# Patient Record
Sex: Male | Born: 2000 | Race: Black or African American | Hispanic: No | Marital: Single | Smoking: Never smoker
Health system: Southern US, Community
[De-identification: ages and names within clinical notes are randomized; demographics above are authoritative.]

## PROBLEM LIST (undated history)

## (undated) DIAGNOSIS — F32A Depression, unspecified: Secondary | ICD-10-CM

## (undated) DIAGNOSIS — F913 Oppositional defiant disorder: Secondary | ICD-10-CM

---
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
## 2021-03-21 ENCOUNTER — Observation Stay (HOSPITAL_BASED_OUTPATIENT_CLINIC_OR_DEPARTMENT_OTHER)
Admission: EM | Admit: 2021-03-21 | Discharge: 2021-03-22 | Disposition: A | Discharge status: Home / Self Care | Payer: Medicaid Other | Attending: Internal Medicine | Admitting: Internal Medicine

## 2021-03-21 ENCOUNTER — Emergency Department (HOSPITAL_BASED_OUTPATIENT_CLINIC_OR_DEPARTMENT_OTHER): Payer: Medicaid Other

## 2021-03-21 ENCOUNTER — Encounter (HOSPITAL_BASED_OUTPATIENT_CLINIC_OR_DEPARTMENT_OTHER): Payer: Self-pay | Admitting: Radiology

## 2021-03-21 ENCOUNTER — Other Ambulatory Visit: Payer: Self-pay

## 2021-03-21 DIAGNOSIS — J188 Other pneumonia, unspecified organism: Secondary | ICD-10-CM | POA: Diagnosis present

## 2021-03-21 DIAGNOSIS — J9601 Acute respiratory failure with hypoxia: Secondary | ICD-10-CM | POA: Diagnosis not present

## 2021-03-21 DIAGNOSIS — J189 Pneumonia, unspecified organism: Principal | ICD-10-CM

## 2021-03-21 DIAGNOSIS — R0602 Shortness of breath: Secondary | ICD-10-CM | POA: Diagnosis present

## 2021-03-21 DIAGNOSIS — Z20822 Contact with and (suspected) exposure to covid-19: Secondary | ICD-10-CM | POA: Diagnosis not present

## 2021-03-21 HISTORY — DX: Oppositional defiant disorder: F91.3

## 2021-03-21 LAB — COMPREHENSIVE METABOLIC PANEL
ALT: 12 U/L (ref 0–44)
AST: 28 U/L (ref 15–41)
Albumin: 4.2 g/dL (ref 3.5–5.0)
Alkaline Phosphatase: 67 U/L (ref 38–126)
Anion gap: 8 (ref 5–15)
BUN: 11 mg/dL (ref 6–20)
CO2: 27 mmol/L (ref 22–32)
Calcium: 8.9 mg/dL (ref 8.9–10.3)
Chloride: 99 mmol/L (ref 98–111)
Creatinine, Ser: 1.24 mg/dL (ref 0.61–1.24)
GFR, Estimated: >60 mL/min (ref >60)
Glucose, Bld: 102 mg/dL — ABNORMAL HIGH (ref 70–99)
Potassium: 4 mmol/L (ref 3.5–5.1)
Sodium: 134 mmol/L — ABNORMAL LOW (ref 135–145)
Total Bilirubin: 0.4 mg/dL (ref 0.3–1.2)
Total Protein: 8.1 g/dL (ref 6.5–8.1)

## 2021-03-21 LAB — CBC WITH DIFFERENTIAL/PLATELET
Abs Immature Granulocytes: 0.04 10*3/uL (ref 0.00–0.07)
Basophils Absolute: 0 10*3/uL (ref 0.0–0.1)
Basophils Relative: 0 %
Eosinophils Absolute: 0.2 10*3/uL (ref 0.0–0.5)
Eosinophils Relative: 2 %
HCT: 43.8 % (ref 39.0–52.0)
Hemoglobin: 14.6 g/dL (ref 13.0–17.0)
Immature Granulocytes: 0 %
Lymphocytes Relative: 8 %
Lymphs Abs: 0.9 10*3/uL (ref 0.7–4.0)
MCH: 28.5 pg (ref 26.0–34.0)
MCHC: 33.3 g/dL (ref 30.0–36.0)
MCV: 85.4 fL (ref 80.0–100.0)
Monocytes Absolute: 1.1 10*3/uL — ABNORMAL HIGH (ref 0.1–1.0)
Monocytes Relative: 9 %
Neutro Abs: 9.4 10*3/uL — ABNORMAL HIGH (ref 1.7–7.7)
Neutrophils Relative %: 81 %
Platelets: 265 10*3/uL (ref 150–400)
RBC: 5.13 MIL/uL (ref 4.22–5.81)
RDW: 12 % (ref 11.5–15.5)
WBC: 11.7 10*3/uL — ABNORMAL HIGH (ref 4.0–10.5)
nRBC: 0 % (ref 0.0–0.2)

## 2021-03-21 LAB — BRAIN NATRIURETIC PEPTIDE: B Natriuretic Peptide: 52.1 pg/mL (ref 0.0–100.0)

## 2021-03-21 LAB — TROPONIN I (HIGH SENSITIVITY): Troponin I (High Sensitivity): 2 ng/L (ref <18)

## 2021-03-21 LAB — RESP PANEL BY RT-PCR (FLU A&B, COVID) ARPGX2
Influenza A by PCR: NEGATIVE
Influenza B by PCR: NEGATIVE
SARS Coronavirus 2 by RT PCR: NEGATIVE

## 2021-03-21 LAB — PROCALCITONIN: Procalcitonin: <0.1 ng/mL

## 2021-03-21 MED ORDER — SODIUM CHLORIDE 0.9 % IV SOLN
1.0000 g | Freq: Once | INTRAVENOUS | Status: AC
  Administered 2021-03-21: 04:00:00 1 g via INTRAVENOUS
  Filled 2021-03-21: qty 10

## 2021-03-21 MED ORDER — IOHEXOL 350 MG/ML SOLN
100.0000 mL | Freq: Once | INTRAVENOUS | Status: AC | PRN
  Administered 2021-03-21: 03:00:00 100 mL via INTRAVENOUS

## 2021-03-21 MED ORDER — ACETAMINOPHEN 325 MG PO TABS
650.0000 mg | ORAL_TABLET | Freq: Four times a day (QID) | ORAL | Status: DC | PRN
  Administered 2021-03-21 – 2021-03-22 (×2): 650 mg via ORAL
  Filled 2021-03-21 (×2): qty 2

## 2021-03-21 MED ORDER — ACETAMINOPHEN 650 MG RE SUPP: 650.0000 mg | Freq: Four times a day (QID) | RECTAL | Status: DC | PRN

## 2021-03-21 MED ORDER — ACETAMINOPHEN 500 MG PO TABS
1000.0000 mg | ORAL_TABLET | Freq: Once | ORAL | Status: AC
  Administered 2021-03-21: 02:00:00 1000 mg via ORAL
  Filled 2021-03-21: qty 2

## 2021-03-21 MED ORDER — ONDANSETRON HCL 4 MG PO TABS: 4.0000 mg | ORAL_TABLET | Freq: Four times a day (QID) | ORAL | Status: DC | PRN

## 2021-03-21 MED ORDER — SODIUM CHLORIDE 0.9 % IV SOLN
500.0000 mg | Freq: Once | INTRAVENOUS | Status: AC
  Administered 2021-03-21: 06:00:00 500 mg via INTRAVENOUS
  Filled 2021-03-21: qty 5

## 2021-03-21 MED ORDER — AZITHROMYCIN 250 MG PO TABS
500.0000 mg | ORAL_TABLET | Freq: Every day | ORAL | Status: DC
  Administered 2021-03-22: 10:00:00 500 mg via ORAL
  Filled 2021-03-21: qty 2

## 2021-03-21 MED ORDER — SODIUM CHLORIDE 0.9 % IV SOLN
2.0000 g | INTRAVENOUS | Status: DC
  Administered 2021-03-22: 03:00:00 2 g via INTRAVENOUS
  Filled 2021-03-21: qty 20

## 2021-03-21 MED ORDER — ALBUTEROL SULFATE (2.5 MG/3ML) 0.083% IN NEBU: 2.5000 mg | INHALATION_SOLUTION | RESPIRATORY_TRACT | Status: DC | PRN

## 2021-03-21 MED ORDER — ONDANSETRON HCL 4 MG/2ML IJ SOLN: 4.0000 mg | Freq: Four times a day (QID) | INTRAMUSCULAR | Status: DC | PRN

## 2021-03-21 MED ORDER — GUAIFENESIN ER 600 MG PO TB12
600.0000 mg | ORAL_TABLET | Freq: Two times a day (BID) | ORAL | Status: DC
  Administered 2021-03-21 – 2021-03-22 (×2): 600 mg via ORAL
  Filled 2021-03-21 (×2): qty 1

## 2021-03-21 NOTE — ED Provider Notes (Signed)
[facility] EMERGENCY DEPARTMENT Provider Note   CSN: [id number] Arrival date & time: 03/21/21  0028     History Chief Complaint  Patient presents with   Fever   Shortness of Breath    Gregory Levine is a 20 y.o. male.  20 yo M with a chief complaints of cough shortness of breath.  Going on for about 3 to 4 days now.  No known sick contacts.  Has been coughing up some blood.  Having some central chest pain worse with coughing.  Denies abdominal pain denies nausea or vomiting denies recent travel immobilization or hospitalization.  Denies estrogen use.  The history is provided by the patient.  Fever Associated symptoms: chest pain and cough   Associated symptoms: no chills, no confusion, no congestion, no diarrhea, no headaches, no myalgias, no rash and no vomiting   Shortness of Breath Associated symptoms: chest pain, cough and fever   Associated symptoms: no abdominal pain, no headaches, no rash and no vomiting   Illness Severity:  Moderate Onset quality:  Gradual Duration:  3 days Timing:  Constant Progression:  Worsening Chronicity:  New Associated symptoms: chest pain, cough, fever and shortness of breath   Associated symptoms: no abdominal pain, no congestion, no diarrhea, no headaches, no myalgias, no rash and no vomiting       History reviewed. No pertinent past medical history.  Patient Active Problem List   Diagnosis Date Noted   Multifocal pneumonia 03/21/2021    History reviewed. No pertinent surgical history.     No family history on file.     Home Medications Prior to Admission medications   Not on File    Allergies    Patient has no known allergies.  Review of Systems   Review of Systems  Constitutional:  Positive for fever. Negative for chills.  HENT:  Negative for congestion and facial swelling.   Eyes:  Negative for discharge and visual disturbance.  Respiratory:  Positive for cough and shortness of breath.    Cardiovascular:  Positive for chest pain. Negative for palpitations.  Gastrointestinal:  Negative for abdominal pain, diarrhea and vomiting.  Musculoskeletal:  Negative for arthralgias and myalgias.  Skin:  Negative for color change and rash.  Neurological:  Negative for tremors, syncope and headaches.  Psychiatric/Behavioral:  Negative for confusion and dysphoric mood.    Physical Exam Updated Vital Signs BP 138/79 (BP Location: Right Arm)    Pulse 100    Temp (!) 100.4 F (38 C) (Oral)    Resp 18    Ht 5\' 7"  (1.702 m)    Wt 81.6 kg    SpO2 95%    BMI 28.19 kg/m   Physical Exam Vitals and nursing note reviewed.  Constitutional:      Appearance: He is well-developed.  HENT:     Head: Normocephalic and atraumatic.  Eyes:     Pupils: Pupils are equal, round, and reactive to light.  Neck:     Vascular: No JVD.  Cardiovascular:     Rate and Rhythm: Regular rhythm. Tachycardia present.     Heart sounds: No murmur heard.   No friction rub. No gallop.  Pulmonary:     Effort: Tachypnea present. No respiratory distress.     Breath sounds: No wheezing.  Abdominal:     General: There is no distension.     Tenderness: There is no abdominal tenderness. There is no guarding or rebound.  Musculoskeletal:  General: Normal range of motion.     Cervical back: Normal range of motion and neck supple.  Skin:    Coloration: Skin is not pale.     Findings: No rash.  Neurological:     Mental Status: He is alert and oriented to person, place, and time.  Psychiatric:        Behavior: Behavior normal.    ED Results / Procedures / Treatments   Labs (all labs ordered are listed, but only abnormal results are displayed) Labs Reviewed  CBC WITH DIFFERENTIAL/PLATELET - Abnormal; Notable for the following components:      Result Value   WBC 11.7 (*)    Neutro Abs 9.4 (*)    Monocytes Absolute 1.1 (*)    All other components within normal limits  COMPREHENSIVE METABOLIC PANEL - Abnormal;  Notable for the following components:   Sodium 134 (*)    Glucose, Bld 102 (*)    All other components within normal limits  RESP PANEL BY RT-PCR (FLU A&B, COVID) ARPGX2  CULTURE, BLOOD (ROUTINE X 2)  CULTURE, BLOOD (ROUTINE X 2)  BRAIN NATRIURETIC PEPTIDE  TROPONIN I (HIGH SENSITIVITY)    EKG None  Radiology CT Angio Chest PE W and/or Wo Contrast  Result Date: 03/21/2021 CLINICAL DATA:  Body aches and cough with fever and chills. EXAM: CT ANGIOGRAPHY CHEST WITH CONTRAST TECHNIQUE: Multidetector CT imaging of the chest was performed using the standard protocol during bolus administration of intravenous contrast. Multiplanar CT image reconstructions and MIPs were obtained to evaluate the vascular anatomy. CONTRAST:  192mL OMNIPAQUE IOHEXOL 350 MG/ML SOLN COMPARISON:  None. FINDINGS: Cardiovascular: Satisfactory opacification of the pulmonary arteries to the segmental level. No evidence of pulmonary embolism. Normal heart size. No pericardial effusion. Mediastinum/Nodes: No enlarged mediastinal, hilar, or axillary lymph nodes. Thyroid gland, trachea, and esophagus demonstrate no significant findings. Lungs/Pleura: Mild multifocal infiltrates are seen are seen within the right upper lobe and posteromedial aspects of the bilateral lower lobes. There is no evidence of a pleural effusion or pneumothorax. Upper Abdomen: No acute abnormality. Musculoskeletal: No chest wall abnormality. No acute or significant osseous findings. Review of the MIP images confirms the above findings. IMPRESSION: 1. Mild multifocal right upper lobe and bilateral lower lobe infiltrates. 2. No evidence of pulmonary embolism. Electronically Signed   By: Virgina Norfolk M.D.   On: 03/21/2021 03:12   DG Chest Port 1 View  Result Date: 03/21/2021 CLINICAL DATA:  Shortness of breath and fevers EXAM: PORTABLE CHEST 1 VIEW COMPARISON:  None. FINDINGS: The heart size and mediastinal contours are within normal limits. Both lungs  are clear. The visualized skeletal structures are unremarkable. IMPRESSION: No active disease. Electronically Signed   By: Inez Catalina M.D.   On: 03/21/2021 02:01    Procedures Procedures   Medications Ordered in ED Medications  cefTRIAXone (ROCEPHIN) 1 g in sodium chloride 0.9 % 100 mL IVPB (has no administration in time range)  azithromycin (ZITHROMAX) 500 mg in sodium chloride 0.9 % 250 mL IVPB (has no administration in time range)  acetaminophen (TYLENOL) tablet 1,000 mg (1,000 mg Oral Given 03/21/21 0213)  iohexol (OMNIPAQUE) 350 MG/ML injection 100 mL (100 mLs Intravenous Contrast Given 03/21/21 0247)    ED Course  I have reviewed the triage vital signs and the nursing notes.  Pertinent labs & imaging results that were available during my care of the patient were reviewed by me and considered in my medical decision making (see chart for details).  MDM Rules/Calculators/A&P                         21 yo M with a chief complaints of cough hemoptysis fever and chest discomfort.  Going on for about 3 to 4 days now.  Tachycardic and hypoxic here.  Denies history of smoking or vaping.  No obvious lung finding for me.  Chest x-ray viewed by me without focal infiltrate.  Concern for possible pulmonary embolism will obtain a CT angiogram of the chest.  CT of the chest with multifocal pneumonia.  Negative for PE.  Discussed with hospitalist for admission.  CRITICAL CARE Performed by: Rae Roam   Total critical care time: 35 minutes  Critical care time was exclusive of separately billable procedures and treating other patients.  Critical care was necessary to treat or prevent imminent or life-threatening deterioration.  Critical care was time spent personally by me on the following activities: development of treatment plan with patient and/or surrogate as well as nursing, discussions with consultants, evaluation of patient's response to treatment, examination of patient,  obtaining history from patient or surrogate, ordering and performing treatments and interventions, ordering and review of laboratory studies, ordering and review of radiographic studies, pulse oximetry and re-evaluation of patient's condition.  The patients results and plan were reviewed and discussed.   Any x-rays performed were independently reviewed by myself.   Differential diagnosis were considered with the presenting HPI.  Medications  cefTRIAXone (ROCEPHIN) 1 g in sodium chloride 0.9 % 100 mL IVPB (has no administration in time range)  azithromycin (ZITHROMAX) 500 mg in sodium chloride 0.9 % 250 mL IVPB (has no administration in time range)  acetaminophen (TYLENOL) tablet 1,000 mg (1,000 mg Oral Given 03/21/21 0213)  iohexol (OMNIPAQUE) 350 MG/ML injection 100 mL (100 mLs Intravenous Contrast Given 03/21/21 0247)    Vitals:   03/21/21 0143 03/21/21 0215 03/21/21 0230 03/21/21 0300  BP:  (!) 151/87 (!) 149/82 138/79  Pulse: (!) 121 (!) 106 (!) 101 100  Resp:    18  Temp:      TempSrc:      SpO2: 90% 95% 94% 95%  Weight:      Height:        Final diagnoses:  Multifocal pneumonia  Acute respiratory failure with hypoxia (HCC)    Admission/ observation were discussed with the admitting physician, patient and/or family and they are comfortable with the plan.      Final Clinical Impression(s) / ED Diagnoses Final diagnoses:  Multifocal pneumonia  Acute respiratory failure with hypoxia [facility])    Rx / DC Orders ED Discharge Orders     None        Melene Plan, DO 03/21/21 [phone]

## 2021-03-21 NOTE — ED Triage Notes (Signed)
Patient coming to ED for evaluation of fever, chills, body aches, and cough.  Symptoms started yesterday. Subjective fever at home.

## 2021-03-21 NOTE — H&P (Signed)
History and Physical    Gregory Levine [id number] DOB: Dec 20, 2000 DOA: 03/21/2021  PCP: Pcp, No  Patient coming from: home  Chief Complaint: fever, coughing blood  HPI: Gregory Levine is a 20 y.o. male with medical history significant of bipolar disorder. Presenting with fever and cough. He had subjective fevers at home starting two days ago. He noticed cough at the same time. It first it was a normal, productive cough. However as it continued into yesterday, he noticed some blood tingled sputum. He didn't try any medicines at home to help. As his symptoms continued in to last night, he became concerned and came to the ED for help. He denies any other aggravating or alleviating factors.    ED Course: CTA showed multifocal PNA. He was started on rocephin and zithro. [facility] was called for admission.   Review of Systems:  Review of systems is otherwise negative for all not mentioned in HPI.   PMHx Past Medical History:  Diagnosis Date   Oppositional defiant disorder     PSHx History reviewed. No pertinent surgical history.  SocHx Marijuana weekly, vapes; no EtOH  No Known Allergies  FamHx Reviewed, non-contributory  Prior to Admission medications   Not on File    Physical Exam: Vitals:   03/21/21 0800 03/21/21 0929 03/21/21 1245 03/21/21 1536  BP: (!) 104/49 120/63 127/72 (!) 151/80  Pulse: 71 85 98 87  Resp: 20 18 16 18   Temp:  98.7 F (37.1 C)  99.2 F (37.3 C)  TempSrc:  Oral  Oral  SpO2: 98% 96% 95% 96%  Weight:      Height:        General: 20 y.o. male resting in bed in NAD Eyes: PERRL, normal sclera ENMT: Nares patent w/o discharge, orophaynx clear, dentition normal, ears w/o discharge/lesions/ulcers Neck: Supple, trachea midline Cardiovascular: RRR, +S1, S2, no m/g/r, equal pulses throughout Respiratory: scattered wheeze, no r/r, normal WOB GI: BS+, NDNT, no masses noted, no organomegaly noted MSK: No e/c/c Neuro: A&O x 3, no focal deficits Psyc:  Appropriate interaction but flat affect, calm/cooperative  Labs on Admission: I have personally reviewed following labs and imaging studies  CBC: Recent Labs  Lab 03/21/21 0205  WBC 11.7*  NEUTROABS 9.4*  HGB 14.6  HCT 43.8  MCV 85.4  PLT 265   Basic Metabolic Panel: Recent Labs  Lab 03/21/21 0205  NA 134*  K 4.0  CL 99  CO2 27  GLUCOSE 102*  BUN 11  CREATININE 1.24  CALCIUM 8.9   GFR: Estimated Creatinine Clearance: 98 mL/min (by C-G formula based on SCr of 1.24 mg/dL). Liver Function Tests: Recent Labs  Lab 03/21/21 0205  AST 28  ALT 12  ALKPHOS 67  BILITOT 0.4  PROT 8.1  ALBUMIN 4.2   No results for input(s): LIPASE, AMYLASE in the last 168 hours. No results for input(s): AMMONIA in the last 168 hours. Coagulation Profile: No results for input(s): INR, PROTIME in the last 168 hours. Cardiac Enzymes: No results for input(s): CKTOTAL, CKMB, CKMBINDEX, TROPONINI in the last 168 hours. BNP (last 3 results) No results for input(s): PROBNP in the last 8760 hours. HbA1C: No results for input(s): HGBA1C in the last 72 hours. CBG: No results for input(s): GLUCAP in the last 168 hours. Lipid Profile: No results for input(s): CHOL, HDL, LDLCALC, TRIG, CHOLHDL, LDLDIRECT in the last 72 hours. Thyroid Function Tests: No results for input(s): TSH, T4TOTAL, FREET4, T3FREE, THYROIDAB in the last 72 hours. Anemia Panel:  No results for input(s): VITAMINB12, FOLATE, FERRITIN, TIBC, IRON, RETICCTPCT in the last 72 hours. Urine analysis: No results found for: COLORURINE, APPEARANCEUR, LABSPEC, PHURINE, GLUCOSEU, HGBUR, BILIRUBINUR, KETONESUR, PROTEINUR, UROBILINOGEN, NITRITE, LEUKOCYTESUR  Radiological Exams on Admission: CT Angio Chest PE W and/or Wo Contrast  Result Date: 03/21/2021 CLINICAL DATA:  Body aches and cough with fever and chills. EXAM: CT ANGIOGRAPHY CHEST WITH CONTRAST TECHNIQUE: Multidetector CT imaging of the chest was performed using the standard  protocol during bolus administration of intravenous contrast. Multiplanar CT image reconstructions and MIPs were obtained to evaluate the vascular anatomy. CONTRAST:  OMNIPAQUE IOHEXOL 350 MG/ML SOLN COMPARISON:  None. FINDINGS: Cardiovascular: Satisfactory opacification of the pulmonary arteries to the segmental level. No evidence of pulmonary embolism. Normal heart size. No pericardial effusion. Mediastinum/Nodes: No enlarged mediastinal, hilar, or axillary lymph nodes. Thyroid gland, trachea, and esophagus demonstrate no significant findings. Lungs/Pleura: Mild multifocal infiltrates are seen are seen within the right upper lobe and posteromedial aspects of the bilateral lower lobes. There is no evidence of a pleural effusion or pneumothorax. Upper Abdomen: No acute abnormality. Musculoskeletal: No chest wall abnormality. No acute or significant osseous findings. Review of the MIP images confirms the above findings. IMPRESSION: 1. Mild multifocal right upper lobe and bilateral lower lobe infiltrates. 2. No evidence of pulmonary embolism. Electronically Signed   By: Aram Candela M.D.   On: 03/21/2021 03:12   DG Chest Port 1 View  Result Date: 03/21/2021 CLINICAL DATA:  Shortness of breath and fevers EXAM: PORTABLE CHEST 1 VIEW COMPARISON:  None. FINDINGS: The heart size and mediastinal contours are within normal limits. Both lungs are clear. The visualized skeletal structures are unremarkable. IMPRESSION: No active disease. Electronically Signed   By: Alcide Clever M.D.   On: 03/21/2021 02:01    EKG: None obtained in ED  Assessment/Plan Multifocal PNA     - placed in obs, tele     - COVID/flu negative     - check respiratory viral panel and procal     - started on CAP coverage, will continue     - add guaifenesin and PRN nebs  Bipolar d/o     - continue home regimen when confirmed  Tobacco abuse Marijuana abuse     - counseled against further use  DVT prophylaxis: SCDs  Code  Status: FULL  Family Communication: None at bedside  Consults called: None   Status is: Observation  The patient remains OBS appropriate and will d/c before 2 midnights.  Time spent coordinating admission: 45 minutes  Abbigael Detlefsen A Kevyn Wengert DO [facility]  If 7PM-7AM, please contact night-coverage [email or web address]  03/21/2021, 3:40 PM

## 2021-03-21 NOTE — Plan of Care (Signed)
  Problem: Education: Goal: Knowledge of General Education information will improve Description: Including pain rating scale, medication(s)/side effects and non-pharmacologic comfort measures Outcome: Progressing   Problem: Clinical Measurements: Goal: Respiratory complications will improve Outcome: Progressing   Problem: Activity: Goal: Risk for activity intolerance will decrease Outcome: Progressing   

## 2021-03-21 NOTE — Plan of Care (Signed)
[facility] transfer note:  20 yo M with multilobar PNA with new O2 requirement.  Admitting to tele bed.  [facility] will assume care on arrival to accepting facility. Until arrival, care as per EDP. However, [facility] available 24/7 for questions and assistance.  Nursing staff, please page [facility] Admits and Consults [phone]) as soon as the patient arrives the hospital.

## 2021-03-21 NOTE — ED Notes (Signed)
Patient's SpO2 saturations in triage noted to be 88% on RA.  Instructed patient to take deep breaths and saturations increased to 91%

## 2021-03-21 NOTE — Progress Notes (Addendum)
[facility] admissions made aware of patient arrival. Dr. Ronaldo Miyamoto to resume care.   Pt arrived to unit room 1436. Aox4. Complaining of headache. Respiratory panel negative will clarify isolation orders with MD. Full assessment to follow.   1545 Dr. Ronaldo Miyamoto in assessing patient. Made aware to isolation dc orders and patients headache.   1630 patients mother Wallene Huh notified of patients transfer and reason for admission. She plans to be here after work to stay with son due to his autism. Will inform charge RN, requested pts mother to bring list of home medications.

## 2021-03-21 NOTE — ED Notes (Signed)
Patient provided with updated plan of care.  All questions answered

## 2021-03-22 ENCOUNTER — Encounter (HOSPITAL_COMMUNITY): Payer: Self-pay | Admitting: Internal Medicine

## 2021-03-22 DIAGNOSIS — J189 Pneumonia, unspecified organism: Secondary | ICD-10-CM | POA: Diagnosis not present

## 2021-03-22 DIAGNOSIS — J206 Acute bronchitis due to rhinovirus: Secondary | ICD-10-CM

## 2021-03-22 DIAGNOSIS — K529 Noninfective gastroenteritis and colitis, unspecified: Secondary | ICD-10-CM

## 2021-03-22 LAB — COMPREHENSIVE METABOLIC PANEL
ALT: 11 U/L (ref 0–44)
AST: 20 U/L (ref 15–41)
Albumin: 3.6 g/dL (ref 3.5–5.0)
Alkaline Phosphatase: 56 U/L (ref 38–126)
Anion gap: 8 (ref 5–15)
BUN: 11 mg/dL (ref 6–20)
CO2: 29 mmol/L (ref 22–32)
Calcium: 9 mg/dL (ref 8.9–10.3)
Chloride: 100 mmol/L (ref 98–111)
Creatinine, Ser: 1.25 mg/dL — ABNORMAL HIGH (ref 0.61–1.24)
GFR, Estimated: >60 mL/min (ref >60)
Glucose, Bld: 102 mg/dL — ABNORMAL HIGH (ref 70–99)
Potassium: 4 mmol/L (ref 3.5–5.1)
Sodium: 137 mmol/L (ref 135–145)
Total Bilirubin: 0.5 mg/dL (ref 0.3–1.2)
Total Protein: 7.1 g/dL (ref 6.5–8.1)

## 2021-03-22 LAB — CBC
HCT: 43.8 % (ref 39.0–52.0)
Hemoglobin: 14.4 g/dL (ref 13.0–17.0)
MCH: 28.7 pg (ref 26.0–34.0)
MCHC: 32.9 g/dL (ref 30.0–36.0)
MCV: 87.4 fL (ref 80.0–100.0)
Platelets: 220 10*3/uL (ref 150–400)
RBC: 5.01 MIL/uL (ref 4.22–5.81)
RDW: 12.2 % (ref 11.5–15.5)
WBC: 7.4 10*3/uL (ref 4.0–10.5)
nRBC: 0 % (ref 0.0–0.2)

## 2021-03-22 LAB — RESPIRATORY PANEL BY PCR

## 2021-03-22 LAB — HIV ANTIBODY (ROUTINE TESTING W REFLEX)
HIV Screen 4th Generation wRfx: NONREACTIVE
HIV Screen 4th Generation wRfx: NONREACTIVE

## 2021-03-22 LAB — PROCALCITONIN: Procalcitonin: <0.1 ng/mL

## 2021-03-22 MED ORDER — HYDROCOD POLST-CPM POLST ER 10-8 MG/5ML PO SUER: 5.0000 mL | Freq: Two times a day (BID) | ORAL | 0 refills | Status: AC | PRN

## 2021-03-22 MED ORDER — HYDROCOD POLST-CPM POLST ER 10-8 MG/5ML PO SUER
5.0000 mL | Freq: Two times a day (BID) | ORAL | Status: DC
  Administered 2021-03-22: 5 mL via ORAL
  Filled 2021-03-22: qty 5

## 2021-03-22 MED ORDER — AMOXICILLIN-POT CLAVULANATE 875-125 MG PO TABS: 1.0000 | ORAL_TABLET | Freq: Two times a day (BID) | ORAL | 0 refills | Status: AC

## 2021-03-22 NOTE — Hospital Course (Signed)
Gregory Levine is a 20 yo male with PMH bipolar disorder who presented with fever and cough.  His symptoms had started approximately 2 to 3 days prior to admission.  Cough was productive of green and yellow sputum with some blood-tinged as well. He underwent work-up with CXR and CT angio chest.  Negative for PE and showed multifocal right upper lobe and bilateral lower lobe infiltrates. Respiratory virus panel was positive for rhinovirus/enterovirus. Given his sputum noted with mixed sputum findings, he was also started on Rocephin and azithromycin. Morning following admission, he was felt to be stable for discharging home with ongoing supportive care.  He was given a course of Augmentin to complete empirically but likely has a viral pneumonia.  Plan discussed with his mom who was also bedside the morning of admission. Tussionex prescription provided for his ongoing cough. 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  He did not endorse any pain or cramping, mostly notes having diarrheal movements after eating and is not precipitated by any certain foods. He was recommended to establish with primary care outpatient and to have continued monitoring and possible further work-up of this.

## 2021-03-22 NOTE — Discharge Summary (Signed)
Physician Discharge Summary   Gregory Levine ZOX:[id number] DOB: Mar 25, 2001 DOA: 03/21/2021  PCP: Oneita Hurt, No  Admit date: 03/21/2021 Discharge date:  03/22/2021  Admitted From: home Disposition:  home Discharging physician: Lewie Chamber, MD  Recommendations for Outpatient Follow-up:  Establish with primary care Consider further evaluation of chronic diarrhea   Home Health:  Equipment/Devices:   Discharge Condition: stable CODE STATUS: Full Diet recommendation:  Diet Orders (From admission, onward)     Start     Ordered   03/22/21 0000  Diet general        03/22/21 1148   03/21/21 1639  Diet regular Room service appropriate? Yes; Fluid consistency: Thin  Diet effective now       Question Answer Comment  Room service appropriate? Yes   Fluid consistency: Thin      03/21/21 1641            Hospital Course: Mr. Aldava is a 20 yo male with PMH bipolar disorder who presented with fever and cough.  His symptoms had started approximately 2 to 3 days prior to admission.  Cough was productive of green and yellow sputum with some blood-tinged as well. He underwent work-up with CXR and CT angio chest.  Negative for PE and showed multifocal right upper lobe and bilateral lower lobe infiltrates. Respiratory virus panel was positive for rhinovirus/enterovirus. Given his sputum noted with mixed sputum findings, he was also started on Rocephin and azithromycin. Morning following admission, he was felt to be stable for discharging home with ongoing supportive care.  He was given a course of Augmentin to complete empirically but likely has a viral pneumonia.  Plan discussed with his mom who was also bedside the morning of admission. Tussionex prescription provided for his ongoing cough. 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  He did not endorse any pain or cramping, mostly notes having diarrheal movements after eating and is not precipitated  by any certain foods. He was recommended to establish with primary care outpatient and to have continued monitoring and possible further work-up of this.  No notes have been filed under this hospital service. Service: Hospitalist   The patient's chronic medical conditions were treated accordingly per the patient's home medication regimen except as noted.  On day of discharge, patient was felt deemed stable for discharge. Patient/family member advised to call PCP or come back to ER if needed.   Principal Diagnosis: Multifocal pneumonia  Discharge Diagnoses: Principal Problem:   Multifocal pneumonia   Discharge Instructions     Diet general   Complete by: As directed    Increase activity slowly   Complete by: As directed       Allergies as of 03/22/2021   No Known Allergies      Medication List     TAKE these medications    amoxicillin-clavulanate 875-125 MG tablet Commonly known as: Augmentin Take 1 tablet by mouth 2 (two) times daily for 5 days. Notes to patient: 03/23/21   chlorpheniramine-HYDROcodone 10-8 MG/5ML Suer Commonly known as: TUSSIONEX Take 5 mLs by mouth every 12 (twelve) hours as needed for cough.   divalproex 250 MG 24 hr tablet Commonly known as: DEPAKOTE ER Take 1,250 mg by mouth daily. Notes to patient: Not given this admit so take as before   FLUoxetine 20 MG capsule Commonly known as: PROZAC Take 20 mg by mouth daily. Notes to patient: Not given this admit so take as before  QUEtiapine 100 MG tablet Commonly known as: SEROQUEL Take 100 mg by mouth at bedtime. Notes to patient: Not given this admit so take as before   traZODone 50 MG tablet Commonly known as: DESYREL Take 50 mg by mouth at bedtime as needed for sleep.        No Known Allergies  Consultations:   Discharge Exam: BP 139/84 (BP Location: Left Arm)    Pulse 64    Temp 99.7 F (37.6 C)    Resp 20    Ht  (1.702 m)    Wt 81.6 kg    SpO2 99%    BMI 28.19 kg/m   Physical Exam Constitutional:      General: He is not in acute distress.    Appearance: Normal appearance.  HENT:     Head: Normocephalic and atraumatic.     Mouth/Throat:     Mouth: Mucous membranes are moist.  Eyes:     Extraocular Movements: Extraocular movements intact.  Cardiovascular:     Rate and Rhythm: Normal rate and regular rhythm.     Heart sounds: Normal heart sounds.  Pulmonary:     Effort: No respiratory distress.     Breath sounds: No wheezing.     Comments: Mild coarse breath sounds bilaterally Abdominal:     General: Bowel sounds are normal. There is no distension.     Palpations: Abdomen is soft.     Tenderness: There is no abdominal tenderness.  Musculoskeletal:        General: Normal range of motion.     Cervical back: Normal range of motion and neck supple.  Skin:    General: Skin is warm and dry.  Neurological:     General: No focal deficit present.     Mental Status: He is alert.  Psychiatric:        Mood and Affect: Mood normal.        Behavior: Behavior normal.     The results of significant diagnostics from this hospitalization (including imaging, microbiology, ancillary and laboratory) are listed below for reference.   Microbiology: Recent Results (from the past 240 hour(s))  Resp Panel by RT-PCR (Flu A&B, Covid) Nasopharyngeal Swab     Status: None   Collection Time: 03/21/21 12:41 AM   Specimen: Nasopharyngeal Swab; Nasopharyngeal(NP) swabs in vial transport medium  Result Value Ref Range Status   SARS Coronavirus 2 by RT PCR NEGATIVE NEGATIVE Final    Comment: (NOTE) SARS-CoV-2 target nucleic acids are NOT DETECTED.  The SARS-CoV-2 RNA is generally detectable in upper respiratory specimens during the acute phase of infection. The lowest concentration of SARS-CoV-2 viral copies this assay can detect is 138 copies/mL. A negative result does not preclude SARS-Cov-2 infection and should not be used as the sole basis for treatment or other  patient management decisions. A negative result may occur with  improper specimen collection/handling, submission of specimen other than nasopharyngeal swab, presence of viral mutation(s) within the areas targeted by this assay, and inadequate number of viral copies(<138 copies/mL). A negative result must be combined with clinical observations, patient history, and epidemiological information. The expected result is Negative.  Fact Sheet for Patients:  BloggerCourse.com  Fact Sheet for Healthcare Providers:  SeriousBroker.it  This test is no t yet approved or cleared by the [place] FDA and  has been authorized for detection and/or diagnosis of SARS-CoV-2 by FDA under an Emergency Use Authorization (EUA). This EUA will remain  in effect (meaning this test  can be used) for the duration of the COVID-19 declaration under Section 564(b)(1) of the Act, 21 U.S.C.section 360bbb-3(b)(1), unless the authorization is terminated  or revoked sooner.       Influenza A by PCR NEGATIVE NEGATIVE Final   Influenza B by PCR NEGATIVE NEGATIVE Final    Comment: (NOTE) The Xpert Xpress SARS-CoV-2/FLU/RSV plus assay is intended as an aid in the diagnosis of influenza from Nasopharyngeal swab specimens and should not be used as a sole basis for treatment. Nasal washings and aspirates are unacceptable for Xpert Xpress SARS-CoV-2/FLU/RSV testing.  Fact Sheet for Patients: BloggerCourse.com  Fact Sheet for Healthcare Providers: SeriousBroker.it  This test is not yet approved or cleared by the [place] FDA and has been authorized for detection and/or diagnosis of SARS-CoV-2 by FDA under an Emergency Use Authorization (EUA). This EUA will remain in effect (meaning this test can be used) for the duration of the COVID-19 declaration under Section 564(b)(1) of the Act, 21 U.S.C. section  360bbb-3(b)(1), unless the authorization is terminated or revoked.  Performed at [facility], [address] [place], [place] [address]   Blood culture (routine x 2)     Status: None (Preliminary result)   Collection Time: 03/21/21  3:40 AM   Specimen: BLOOD RIGHT ARM  Result Value Ref Range Status   Specimen Description   Final    BLOOD RIGHT ARM Performed at [facility], [address] [place], [place] [address]    Special Requests   Final    BOTTLES DRAWN AEROBIC AND ANAEROBIC BCAV Performed at [facility], [address] [place], [place] [address]    Culture   Final    NO GROWTH 1 DAY Performed at [facility], [address] [place], [place] [address]    Report Status PENDING  Incomplete  Blood culture (routine x 2)     Status: None (Preliminary result)   Collection Time: 03/21/21  3:55 AM   Specimen: BLOOD RIGHT HAND  Result Value Ref Range Status   Specimen Description   Final    BLOOD RIGHT HAND Performed at [facility], [address] [place], [place] [address]    Special Requests   Final    BOTTLES DRAWN AEROBIC AND ANAEROBIC BCAV Performed at [facility], [address] [place], [place] [address]    Culture   Final    NO GROWTH 1 DAY Performed at [facility], [address] [place], [place] [address]    Report Status PENDING  Incomplete  Respiratory (~20 pathogens) panel by PCR     Status: Abnormal   Collection Time: 03/21/21  7:00 PM   Specimen: Nasopharyngeal Swab; Respiratory  Result Value Ref Range Status   Adenovirus NOT DETECTED NOT DETECTED Final   Coronavirus 229E NOT DETECTED NOT DETECTED Final    Comment: (NOTE) The Coronavirus on the Respiratory Panel, DOES NOT test for the novel  Coronavirus (2019 nCoV)    Coronavirus HKU1 NOT DETECTED NOT DETECTED Final   Coronavirus NL63 NOT DETECTED NOT DETECTED Final   Coronavirus OC43 NOT DETECTED NOT DETECTED Final    Metapneumovirus NOT DETECTED NOT DETECTED Final   Rhinovirus / Enterovirus DETECTED (A) NOT DETECTED Final   Influenza A NOT DETECTED NOT DETECTED Final   Influenza B NOT DETECTED NOT DETECTED Final   Parainfluenza Virus 1 NOT DETECTED NOT DETECTED Final   Parainfluenza Virus 2  NOT DETECTED NOT DETECTED Final   Parainfluenza Virus 3 NOT DETECTED NOT DETECTED Final   Parainfluenza Virus 4 NOT DETECTED NOT DETECTED Final   Respiratory Syncytial Virus NOT DETECTED NOT DETECTED Final   Bordetella pertussis NOT DETECTED NOT DETECTED Final   Bordetella Parapertussis NOT DETECTED NOT DETECTED Final   Chlamydophila pneumoniae NOT DETECTED NOT DETECTED Final   Mycoplasma pneumoniae NOT DETECTED NOT DETECTED Final    Comment: Performed at [facility], [address] [place], [place] [address]     Labs: BNP (last 3 results) Recent Labs    03/21/21 0205  BNP 52.1   Basic Metabolic Panel: Recent Labs  Lab 03/21/21 0205 03/22/21 0348  NA 134* 137  K 4.0 4.0  CL 99 100  CO2 27 29  GLUCOSE 102* 102*  BUN 11 11  CREATININE 1.24 1.25*  CALCIUM 8.9 9.0   Liver Function Tests: Recent Labs  Lab 03/21/21 0205 03/22/21 0348  AST 28 20  ALT 12 11  ALKPHOS 67 56  BILITOT 0.4 0.5  PROT 8.1 7.1  ALBUMIN 4.2 3.6   No results for input(s): LIPASE, AMYLASE in the last 168 hours. No results for input(s): AMMONIA in the last 168 hours. CBC: Recent Labs  Lab 03/21/21 0205 03/22/21 0348  WBC 11.7* 7.4  NEUTROABS 9.4*  --   HGB 14.6 14.4  HCT 43.8 43.8  MCV 85.4 87.4  PLT 265 220   Cardiac Enzymes: No results for input(s): CKTOTAL, CKMB, CKMBINDEX, TROPONINI in the last 168 hours. BNP: Invalid input(s): POCBNP CBG: No results for input(s): GLUCAP in the last 168 hours. D-Dimer No results for input(s): DDIMER in the last 72 hours. Hgb A1c No results for input(s): HGBA1C in the last 72 hours. Lipid Profile No results for input(s): CHOL, HDL, LDLCALC, TRIG, CHOLHDL,  LDLDIRECT in the last 72 hours. Thyroid function studies No results for input(s): TSH, T4TOTAL, T3FREE, THYROIDAB in the last 72 hours.  Invalid input(s): FREET3 Anemia work up No results for input(s): VITAMINB12, FOLATE, FERRITIN, TIBC, IRON, RETICCTPCT in the last 72 hours. Urinalysis No results found for: COLORURINE, APPEARANCEUR, LABSPEC, PHURINE, GLUCOSEU, HGBUR, BILIRUBINUR, KETONESUR, PROTEINUR, UROBILINOGEN, NITRITE, LEUKOCYTESUR Sepsis Labs Invalid input(s): PROCALCITONIN,  WBC,  LACTICIDVEN Microbiology Recent Results (from the past 240 hour(s))  Resp Panel by RT-PCR (Flu A&B, Covid) Nasopharyngeal Swab     Status: None   Collection Time: 03/21/21 12:41 AM   Specimen: Nasopharyngeal Swab; Nasopharyngeal(NP) swabs in vial transport medium  Result Value Ref Range Status   SARS Coronavirus 2 by RT PCR NEGATIVE NEGATIVE Final    Comment: (NOTE) SARS-CoV-2 target nucleic acids are NOT DETECTED.  The SARS-CoV-2 RNA is generally detectable in upper respiratory specimens during the acute phase of infection. The lowest concentration of SARS-CoV-2 viral copies this assay can detect is 138 copies/mL. A negative result does not preclude SARS-Cov-2 infection and should not be used as the sole basis for treatment or other patient management decisions. A negative result may occur with  improper specimen collection/handling, submission of specimen other than nasopharyngeal swab, presence of viral mutation(s) within the areas targeted by this assay, and inadequate number of viral copies(<138 copies/mL). A negative result must be combined with clinical observations, patient history, and epidemiological information. The expected result is Negative.  Fact Sheet for Patients:  BloggerCourse.com  Fact Sheet for Healthcare Providers:  SeriousBroker.it  This test is no t yet approved or cleared by the [place] and  has been  authorized  for detection and/or diagnosis of SARS-CoV-2 by FDA under an Emergency Use Authorization (EUA). This EUA will remain  in effect (meaning this test can be used) for the duration of the COVID-19 declaration under Section 564(b)(1) of the Act, 21 U.S.C.section 360bbb-3(b)(1), unless the authorization is terminated  or revoked sooner.       Influenza A by PCR NEGATIVE NEGATIVE Final   Influenza B by PCR NEGATIVE NEGATIVE Final    Comment: (NOTE) The Xpert Xpress SARS-CoV-2/FLU/RSV plus assay is intended as an aid in the diagnosis of influenza from Nasopharyngeal swab specimens and should not be used as a sole basis for treatment. Nasal washings and aspirates are unacceptable for Xpert Xpress SARS-CoV-2/FLU/RSV testing.  Fact Sheet for Patients: BloggerCourse.com  Fact Sheet for Healthcare Providers: SeriousBroker.it  This test is not yet approved or cleared by the [place] FDA and has been authorized for detection and/or diagnosis of SARS-CoV-2 by FDA under an Emergency Use Authorization (EUA). This EUA will remain in effect (meaning this test can be used) for the duration of the COVID-19 declaration under Section 564(b)(1) of the Act, 21 U.S.C. section 360bbb-3(b)(1), unless the authorization is terminated or revoked.  Performed at [facility], [address] [place], [place] [address]   Blood culture (routine x 2)     Status: None (Preliminary result)   Collection Time: 03/21/21  3:40 AM   Specimen: BLOOD RIGHT ARM  Result Value Ref Range Status   Specimen Description   Final    BLOOD RIGHT ARM Performed at [facility], [address] [place], [place] [address]    Special Requests   Final    BOTTLES DRAWN AEROBIC AND ANAEROBIC BCAV Performed at [facility], [address] [place], [place] [address]    Culture   Final    NO GROWTH 1 DAY Performed at [facility], [address], [place], [place] [address]    Report Status PENDING  Incomplete  Blood culture (routine x 2)     Status: None (Preliminary result)   Collection Time: 03/21/21  3:55 AM   Specimen: BLOOD RIGHT HAND  Result Value Ref Range Status   Specimen Description   Final    BLOOD RIGHT HAND Performed at [facility], [address] [place], [place] [address]    Special Requests   Final    BOTTLES DRAWN AEROBIC AND ANAEROBIC BCAV Performed at [facility], [address] [place], [place] [address]    Culture   Final    NO GROWTH 1 DAY Performed at [facility], [address], [place], [place] [address]    Report Status PENDING  Incomplete  Respiratory (~20 pathogens) panel by PCR     Status: Abnormal   Collection Time: 03/21/21  7:00 PM   Specimen: Nasopharyngeal Swab; Respiratory  Result Value Ref Range Status   Adenovirus NOT DETECTED NOT DETECTED Final   Coronavirus 229E NOT DETECTED NOT DETECTED Final    Comment: (NOTE) The Coronavirus on the Respiratory Panel, DOES NOT test for the novel  Coronavirus (2019 nCoV)    Coronavirus HKU1 NOT DETECTED NOT DETECTED Final   Coronavirus NL63 NOT DETECTED NOT DETECTED Final   Coronavirus OC43 NOT DETECTED NOT DETECTED Final   Metapneumovirus NOT DETECTED NOT DETECTED Final   Rhinovirus / Enterovirus DETECTED (A) NOT DETECTED Final   Influenza A NOT DETECTED NOT DETECTED Final  Influenza B NOT DETECTED NOT DETECTED Final   Parainfluenza Virus 1 NOT DETECTED NOT DETECTED Final   Parainfluenza Virus 2 NOT DETECTED NOT DETECTED Final   Parainfluenza Virus 3 NOT DETECTED NOT DETECTED Final   Parainfluenza Virus 4 NOT DETECTED NOT DETECTED Final   Respiratory Syncytial Virus NOT DETECTED NOT DETECTED Final   Bordetella pertussis NOT DETECTED NOT DETECTED Final   Bordetella Parapertussis NOT DETECTED NOT DETECTED Final   Chlamydophila pneumoniae NOT DETECTED NOT DETECTED Final   Mycoplasma pneumoniae  NOT DETECTED NOT DETECTED Final    Comment: Performed at [facility], [address], [place], [place] [address]    Procedures/Studies: CT Angio Chest PE W and/or Wo Contrast  Result Date: 03/21/2021 CLINICAL DATA:  Body aches and cough with fever and chills. EXAM: CT ANGIOGRAPHY CHEST WITH CONTRAST TECHNIQUE: Multidetector CT imaging of the chest was performed using the standard protocol during bolus administration of intravenous contrast. Multiplanar CT image reconstructions and MIPs were obtained to evaluate the vascular anatomy. CONTRAST:  OMNIPAQUE IOHEXOL 350 MG/ML SOLN COMPARISON:  None. FINDINGS: Cardiovascular: Satisfactory opacification of the pulmonary arteries to the segmental level. No evidence of pulmonary embolism. Normal heart size. No pericardial effusion. Mediastinum/Nodes: No enlarged mediastinal, hilar, or axillary lymph nodes. Thyroid gland, trachea, and esophagus demonstrate no significant findings. Lungs/Pleura: Mild multifocal infiltrates are seen are seen within the right upper lobe and posteromedial aspects of the bilateral lower lobes. There is no evidence of a pleural effusion or pneumothorax. Upper Abdomen: No acute abnormality. Musculoskeletal: No chest wall abnormality. No acute or significant osseous findings. Review of the MIP images confirms the above findings. IMPRESSION: 1. Mild multifocal right upper lobe and bilateral lower lobe infiltrates. 2. No evidence of pulmonary embolism. Electronically Signed   By: Aram Candela M.D.   On: 03/21/2021 03:12   DG Chest Port 1 View  Result Date: 03/21/2021 CLINICAL DATA:  Shortness of breath and fevers EXAM: PORTABLE CHEST 1 VIEW COMPARISON:  None. FINDINGS: The heart size and mediastinal contours are within normal limits. Both lungs are clear. The visualized skeletal structures are unremarkable. IMPRESSION: No active disease. Electronically Signed   By: Alcide Clever M.D.   On: 03/21/2021 02:01     Time  coordinating discharge: Over 30 minutes    Lewie Chamber, MD  [facility] 03/22/2021, 1:02 PM

## 2021-03-26 LAB — CULTURE, BLOOD (ROUTINE X 2)
Culture: NO GROWTH
Culture: NO GROWTH

## 2022-07-02 ENCOUNTER — Emergency Department (HOSPITAL_BASED_OUTPATIENT_CLINIC_OR_DEPARTMENT_OTHER)
Admission: EM | Admit: 2022-07-02 | Discharge: 2022-07-03 | Disposition: A | Discharge status: Home / Self Care | Payer: Medicaid Other | Attending: Emergency Medicine | Admitting: Emergency Medicine

## 2022-07-02 ENCOUNTER — Encounter (HOSPITAL_BASED_OUTPATIENT_CLINIC_OR_DEPARTMENT_OTHER): Payer: Self-pay | Admitting: *Deleted

## 2022-07-02 ENCOUNTER — Other Ambulatory Visit: Payer: Self-pay

## 2022-07-02 DIAGNOSIS — R454 Irritability and anger: Secondary | ICD-10-CM | POA: Insufficient documentation

## 2022-07-02 DIAGNOSIS — R456 Violent behavior: Secondary | ICD-10-CM | POA: Diagnosis present

## 2022-07-02 HISTORY — DX: Depression, unspecified: F32.A

## 2022-07-02 LAB — COMPREHENSIVE METABOLIC PANEL
ALT: 39 U/L (ref 0–44)
AST: 78 U/L — ABNORMAL HIGH (ref 15–41)
Albumin: 4.3 g/dL (ref 3.5–5.0)
Alkaline Phosphatase: 65 U/L (ref 38–126)
Anion gap: 7 (ref 5–15)
BUN: 17 mg/dL (ref 6–20)
CO2: 27 mmol/L (ref 22–32)
Calcium: 9.2 mg/dL (ref 8.9–10.3)
Chloride: 102 mmol/L (ref 98–111)
Creatinine, Ser: 1.4 mg/dL — ABNORMAL HIGH (ref 0.61–1.24)
GFR, Estimated: >60 mL/min (ref >60)
Glucose, Bld: 104 mg/dL — ABNORMAL HIGH (ref 70–99)
Potassium: 4.1 mmol/L (ref 3.5–5.1)
Sodium: 136 mmol/L (ref 135–145)
Total Bilirubin: 0.5 mg/dL (ref 0.3–1.2)
Total Protein: 7.8 g/dL (ref 6.5–8.1)

## 2022-07-02 LAB — CBC
HCT: 45.1 % (ref 39.0–52.0)
Hemoglobin: 14.8 g/dL (ref 13.0–17.0)
MCH: 27.8 pg (ref 26.0–34.0)
MCHC: 32.8 g/dL (ref 30.0–36.0)
MCV: 84.8 fL (ref 80.0–100.0)
Platelets: 305 10*3/uL (ref 150–400)
RBC: 5.32 MIL/uL (ref 4.22–5.81)
RDW: 11.5 % (ref 11.5–15.5)
WBC: 7.4 10*3/uL (ref 4.0–10.5)
nRBC: 0 % (ref 0.0–0.2)

## 2022-07-02 LAB — RAPID URINE DRUG SCREEN, HOSP PERFORMED
Amphetamines: NOT DETECTED
Barbiturates: NOT DETECTED
Benzodiazepines: NOT DETECTED
Cocaine: NOT DETECTED
Opiates: NOT DETECTED
Tetrahydrocannabinol: NOT DETECTED

## 2022-07-02 LAB — ETHANOL: Alcohol, Ethyl (B): <10 mg/dL (ref <10)

## 2022-07-02 LAB — ACETAMINOPHEN LEVEL: Acetaminophen (Tylenol), Serum: <10 ug/mL — ABNORMAL LOW (ref 10–30)

## 2022-07-02 LAB — SALICYLATE LEVEL: Salicylate Lvl: <7 mg/dL — ABNORMAL LOW (ref 7.0–30.0)

## 2022-07-02 NOTE — ED Notes (Signed)
Called [facility] around 11:00, was told patient is next in line for TTS consult.

## 2022-07-02 NOTE — ED Notes (Signed)
Pt given dinner tray and drink. Tolerating well.

## 2022-07-02 NOTE — ED Notes (Signed)
Patient may eat per EDP.

## 2022-07-02 NOTE — ED Triage Notes (Signed)
Pt is here as he wants to speak with a therapist about his anger issues.  He is currently living with his mother and he expresses wanting help getting into a different living situation.  Pt denies any SI or HI.

## 2022-07-02 NOTE — BH Assessment (Signed)
Pt gave verbal authorization to contact his mother. He stated she was his legal guardian. This Clinical research associate attempted phone contact with mother, Francoise Ceo, [phone] and reached voicemail. HIPAA compliant message left request a return call.
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
## 2022-07-02 NOTE — ED Notes (Signed)
TTS called monitor @ bedside

## 2022-07-03 NOTE — ED Notes (Signed)
Pt is calling his mom for ride home. Pt is calm and cooperative
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
## 2022-07-03 NOTE — ED Provider Notes (Signed)
Patient has been seen by psychiatry.  Cleared for discharge with outpatient resources.  He is not suicidal homicidal.   Glynn Octave, MD 07/03/22 [phone]

## 2022-07-03 NOTE — Discharge Instructions (Signed)
Follow-up with your therapist and the resources provided.  Return to the ED with suicidal thoughts, homicidal thoughts or any other concerns.

## 2023-01-21 IMAGING — DX DG CHEST 1V PORT
1 series · 1 of 1 positions shown · non-contrast
Comparison: None.

CLINICAL DATA: Shortness of breath and fevers

EXAM:
PORTABLE CHEST 1 VIEW

[chest ap]
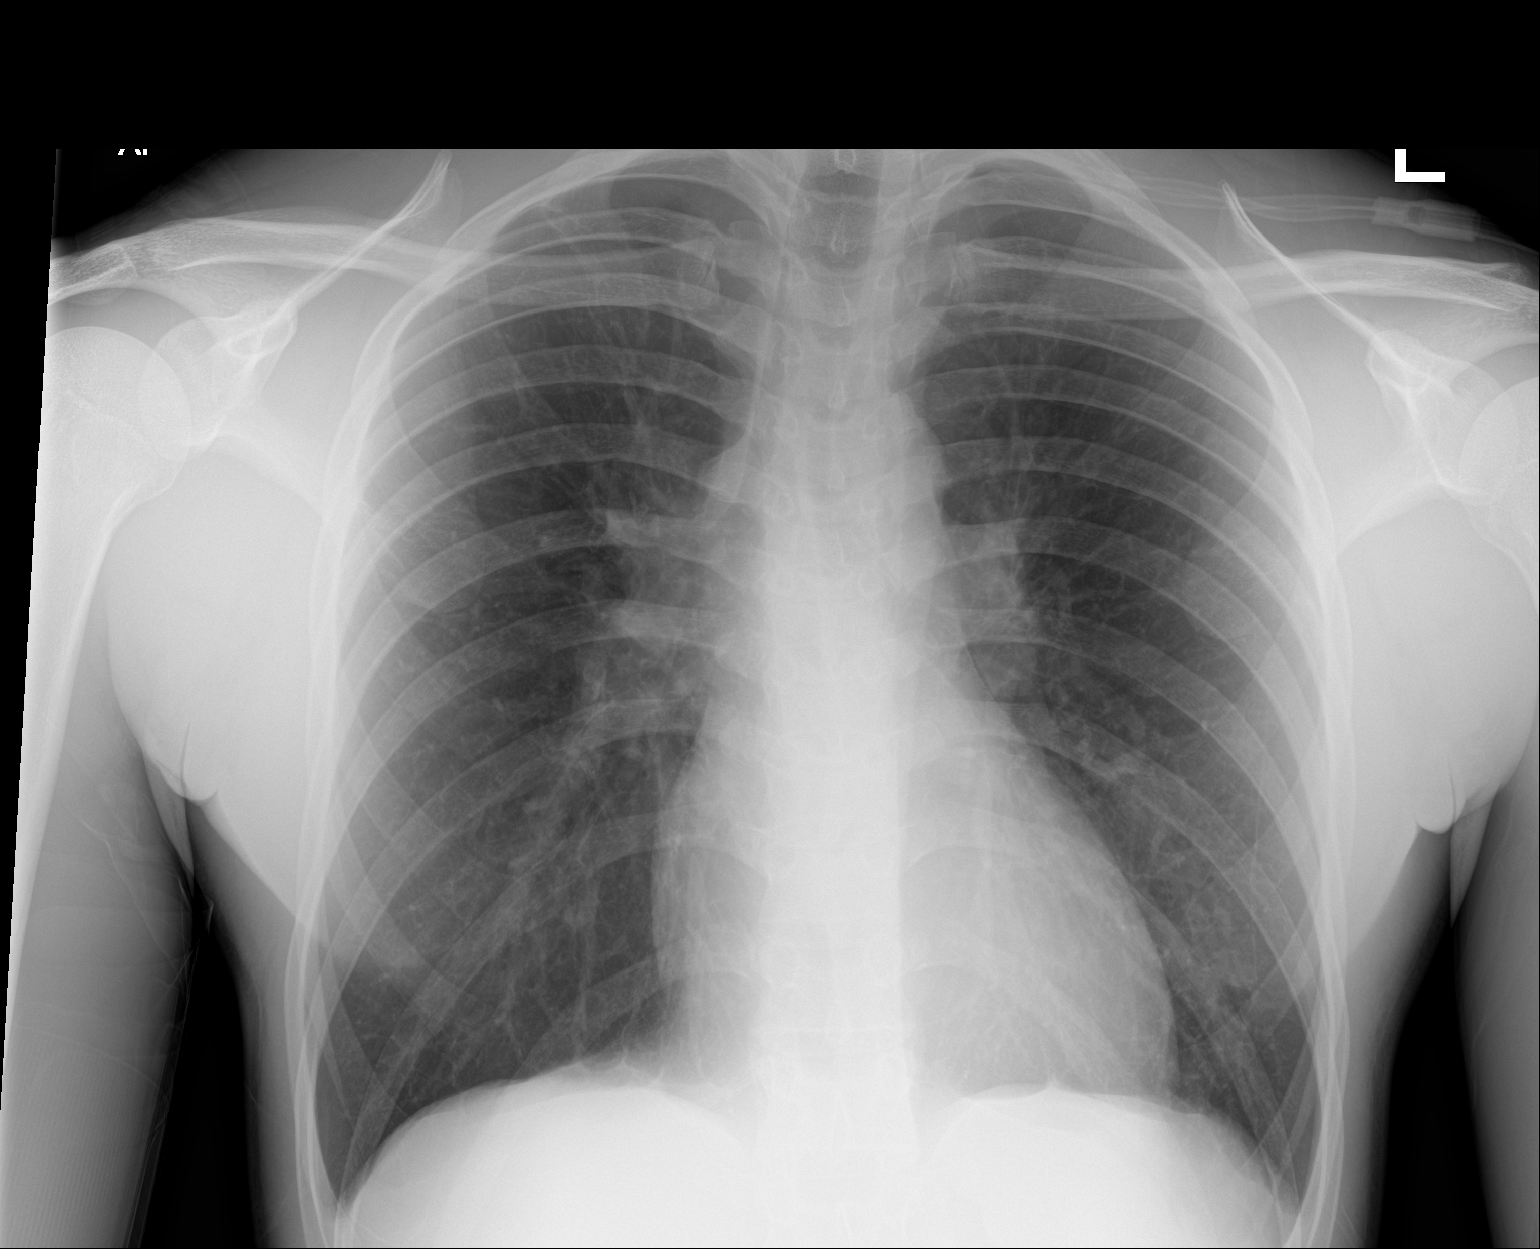

[1 of 1 positions shown; findings below may reference images not displayed]

FINDINGS: The heart size and mediastinal contours are within normal limits.
Both lungs are clear. The visualized skeletal structures are
unremarkable.
IMPRESSION: No active disease.

## 2023-01-21 IMAGING — CT CT ANGIO CHEST
2 of 8 series · 19 of 36 positions shown · IV contrast (Omnipaque)
Comparison: None.

CLINICAL DATA: Body aches and cough with fever and chills.

EXAM:
CT ANGIOGRAPHY CHEST WITH CONTRAST
TECHNIQUE: Multidetector CT imaging of the chest was performed using the
standard protocol during bolus administration of intravenous
contrast. Multiplanar CT image reconstructions and MIPs were
obtained to evaluate the vascular anatomy.
CONTRAST:  100mL OMNIPAQUE IOHEXOL 350 MG/ML SOLN

[Series 6: pe thins · axial · 0.64mm/px · z∈[-298,-37]mm · 18 of 293 slices shown]
[im 16/293  lung]
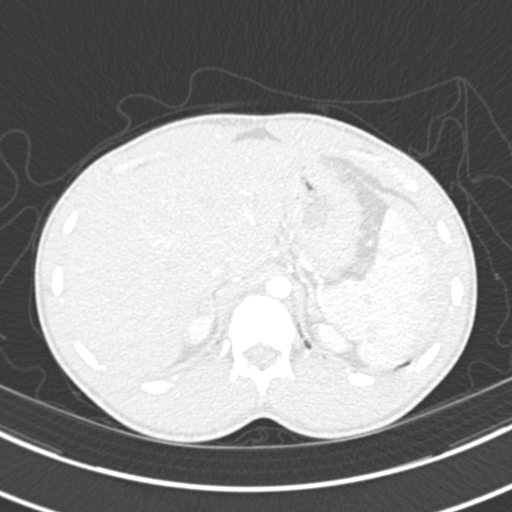
[im 31/293  mediastinal]
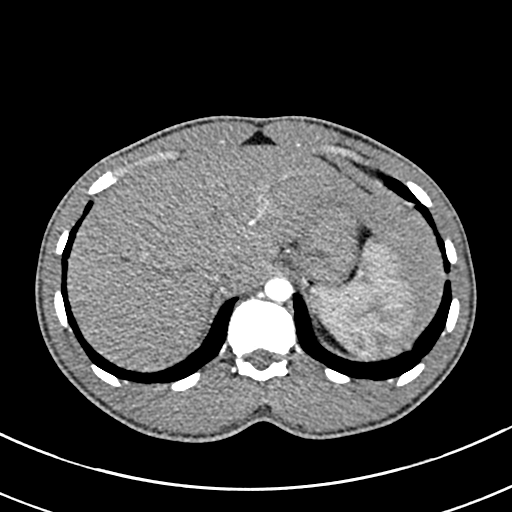
[im 47/293  lung]
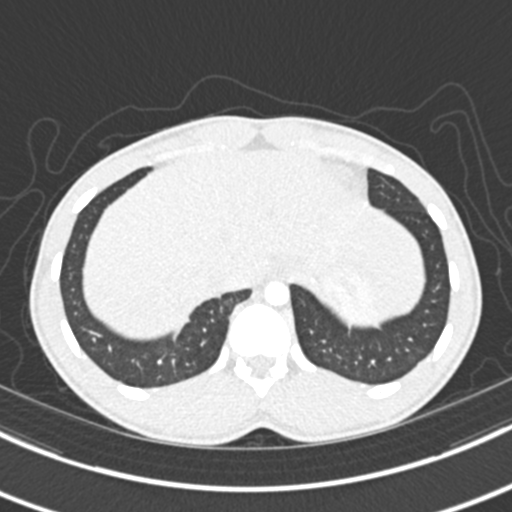
[im 62/293  mediastinal]
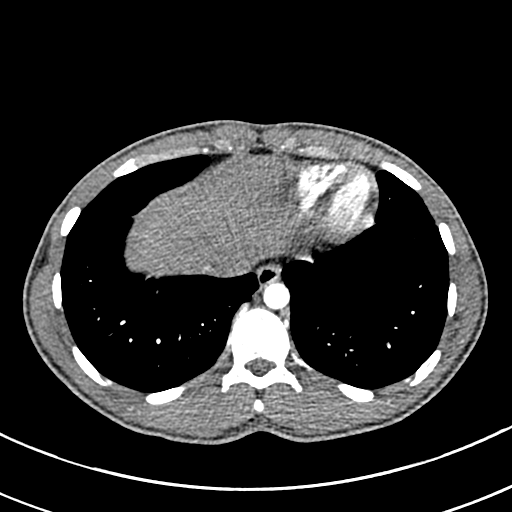
[im 77/293  lung]
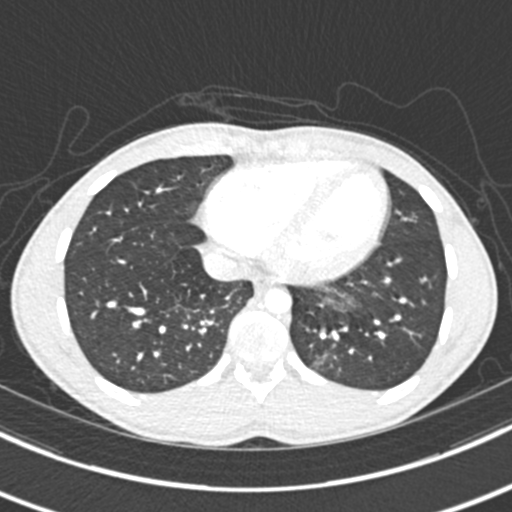
[im 93/293  mediastinal]
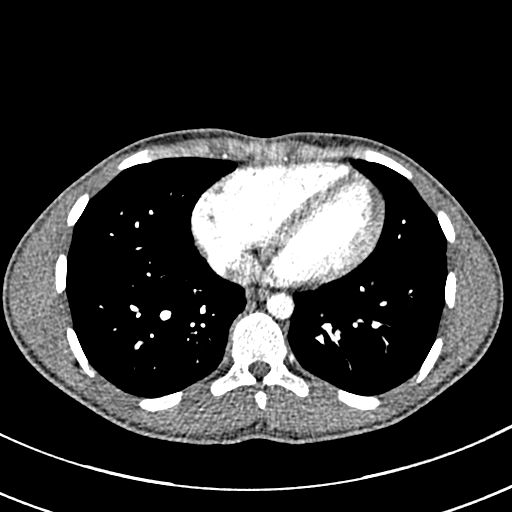
[im 108/293  lung]
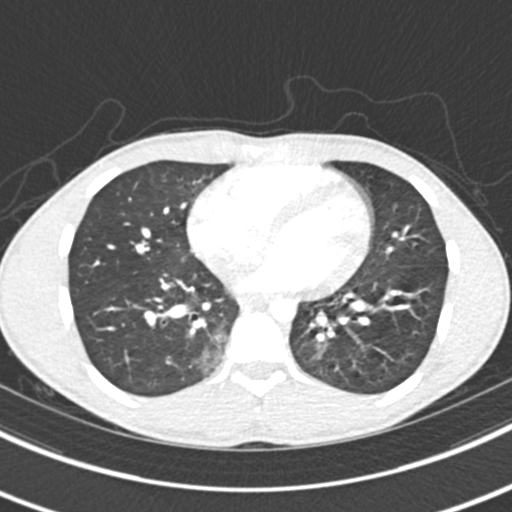
[im 123/293  mediastinal]
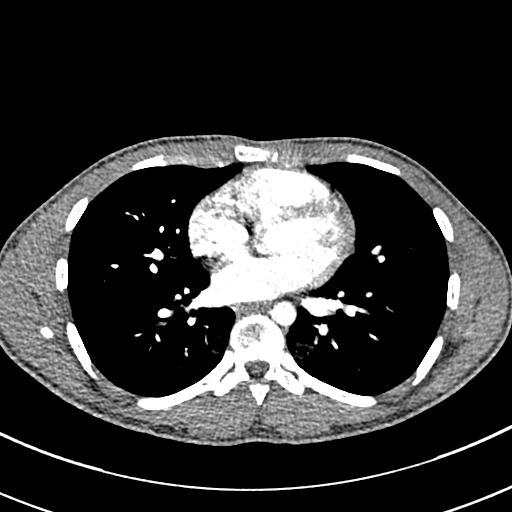
[im 139/293  lung]
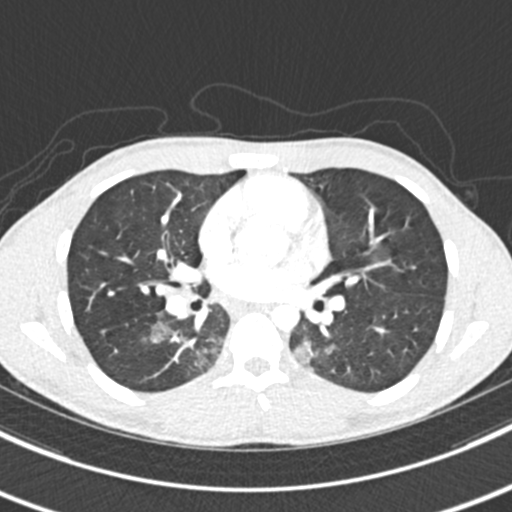
[im 154/293  mediastinal]
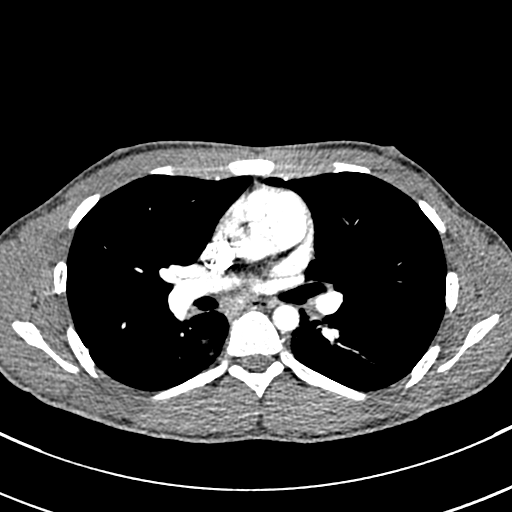
[im 170/293  lung]
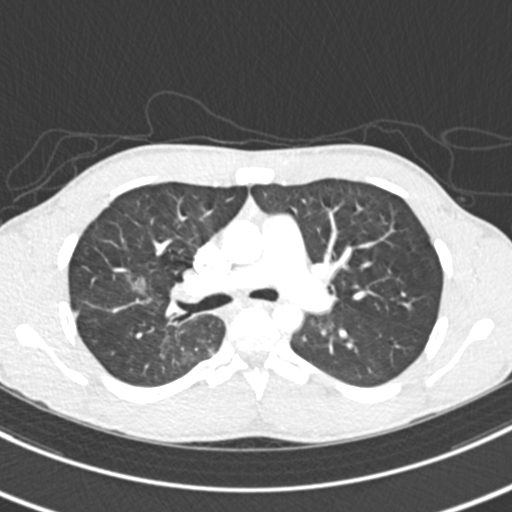
[im 185/293  mediastinal]
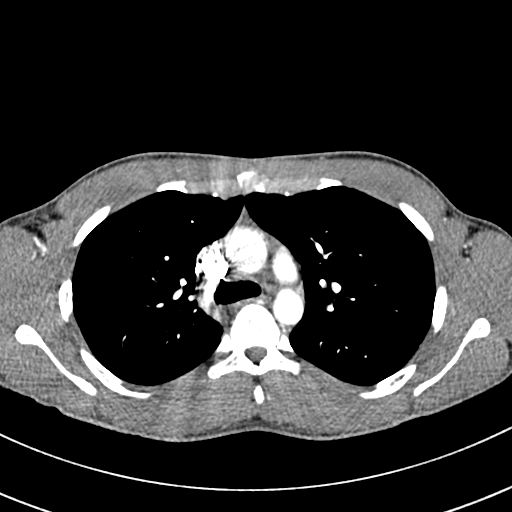
[im 200/293  lung]
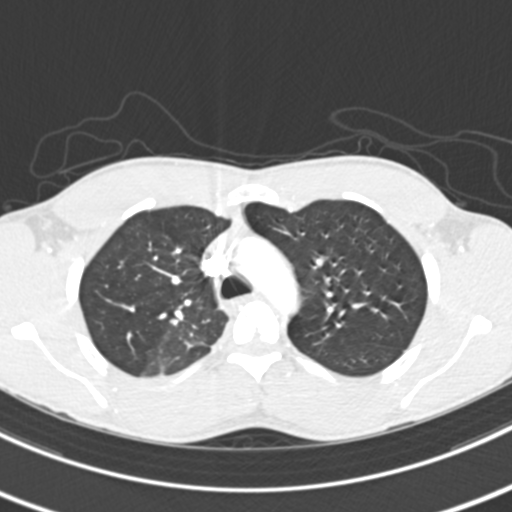
[im 216/293  mediastinal]
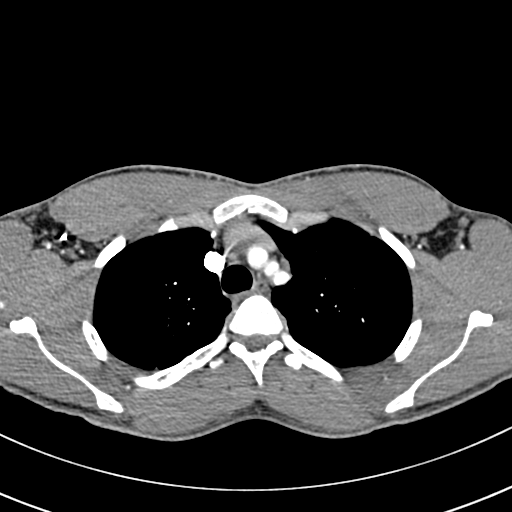
[im 231/293  lung]
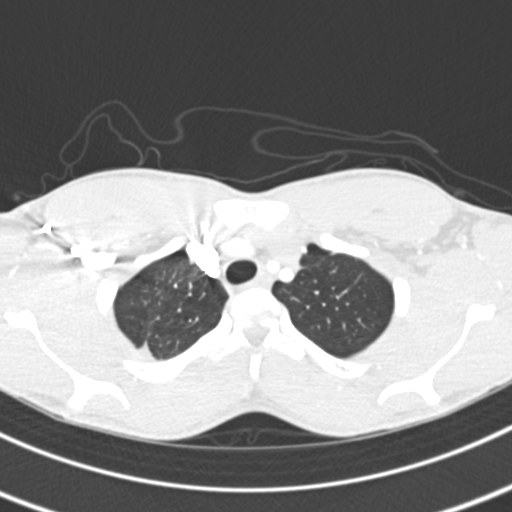
[im 246/293  mediastinal]
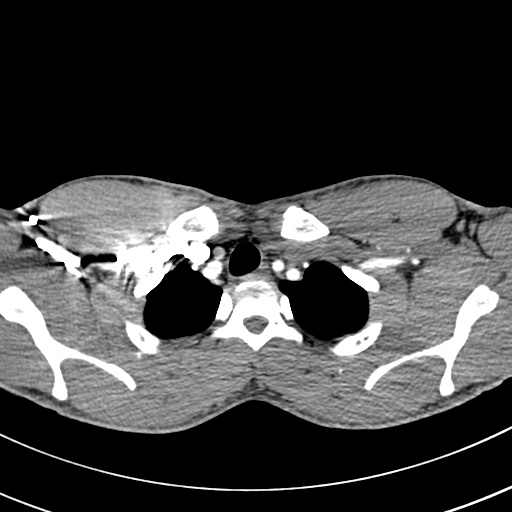
[im 262/293  lung]
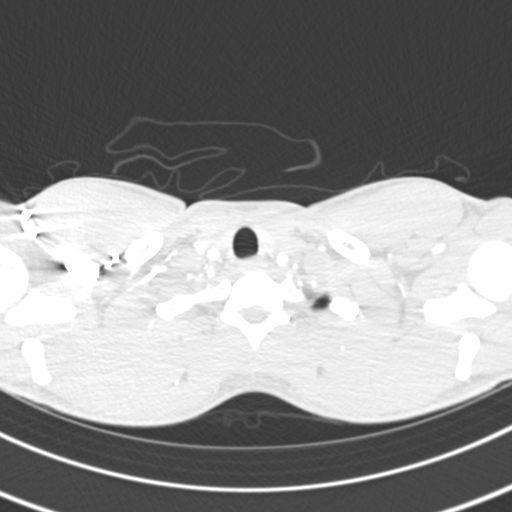
[im 277/293  mediastinal]
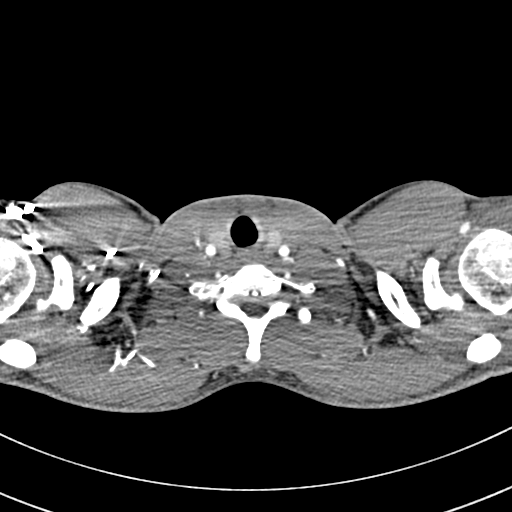

[Series 7: pe coronal mpr · coronal · 0.60mm/px · 1 of 104 slices shown]
[im 52/104  mediastinal]
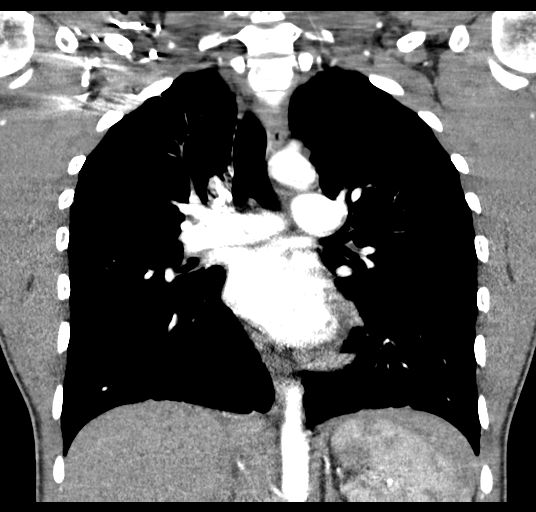

[19 of 36 positions shown; findings below may reference images not displayed]

FINDINGS: Cardiovascular: Satisfactory opacification of the pulmonary arteries
to the segmental level. No evidence of pulmonary embolism. Normal
heart size. No pericardial effusion.

Mediastinum/Nodes: No enlarged mediastinal, hilar, or axillary lymph
nodes. Thyroid gland, trachea, and esophagus demonstrate no
significant findings.

Lungs/Pleura: Mild multifocal infiltrates are seen are seen within
the right upper lobe and posteromedial aspects of the bilateral
lower lobes.

There is no evidence of a pleural effusion or pneumothorax.

Upper Abdomen: No acute abnormality.

Musculoskeletal: No chest wall abnormality. No acute or significant
osseous findings.

Review of the MIP images confirms the above findings.
IMPRESSION: 1. Mild multifocal right upper lobe and bilateral lower lobe
infiltrates.
2. No evidence of pulmonary embolism.
